# Patient Record
Sex: Female | Born: 1985 | Race: White | Hispanic: No | Marital: Married
Health system: Southern US, Community
[De-identification: ages and names within clinical notes are randomized; demographics above are authoritative.]

---
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
## 2007-12-31 ENCOUNTER — Ambulatory Visit: Payer: Self-pay | Admitting: Internal Medicine

## 2008-01-01 ENCOUNTER — Ambulatory Visit: Payer: Self-pay | Admitting: Internal Medicine

## 2008-02-18 ENCOUNTER — Ambulatory Visit: Payer: Self-pay

## 2008-03-27 ENCOUNTER — Ambulatory Visit: Payer: Self-pay | Admitting: Family Medicine

## 2008-04-10 ENCOUNTER — Ambulatory Visit: Payer: Self-pay | Admitting: Family Medicine

## 2008-09-20 IMAGING — CT CT ABD-PELV W/O CM
1 of 2 series · 14 of 32 positions shown, 18 images · non-contrast
Comparison: none

REASON FOR EXAM: lower abd pain hematuria fever  eval for kidney stones
COMMENTS:

[Series 2: soft tissue · axial · 0.66mm/px · z∈[-456,-64]mm · 14 of 149 slices shown, 18 images]
[im 12/149  soft-tissue]
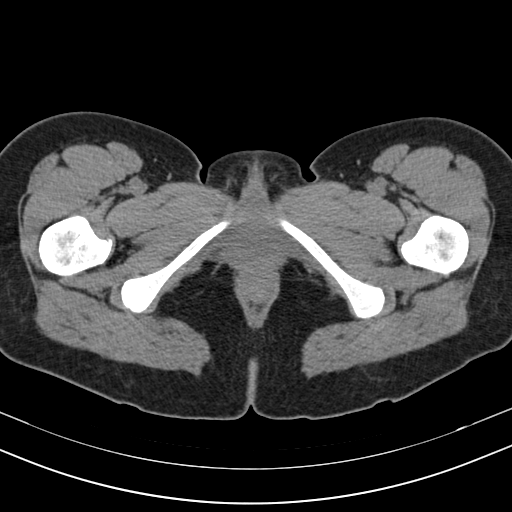
[im 12/149  bone]
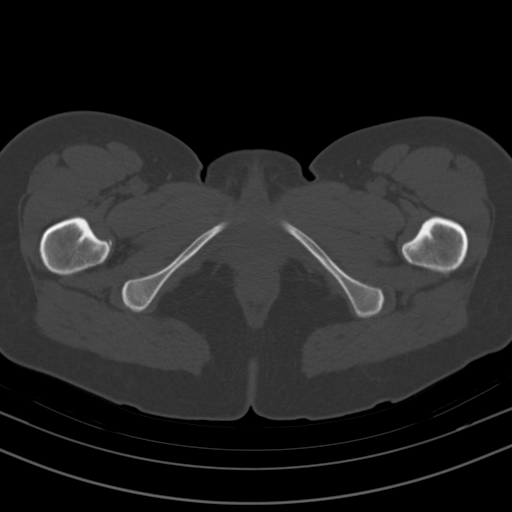
[im 23/149  soft-tissue]
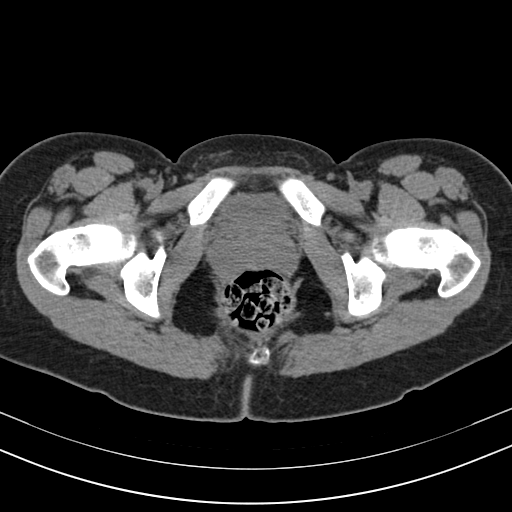
[im 35/149  soft-tissue]
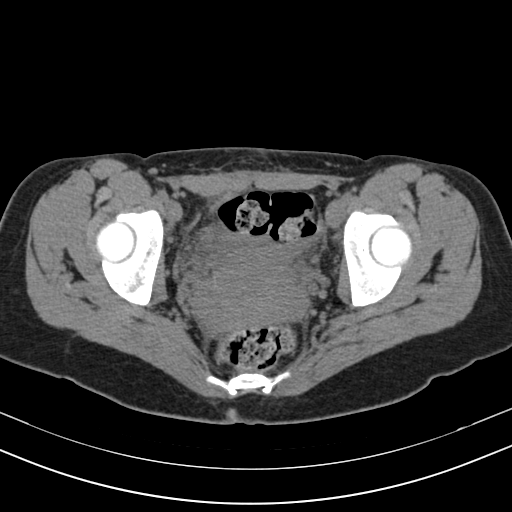
[im 46/149  soft-tissue]
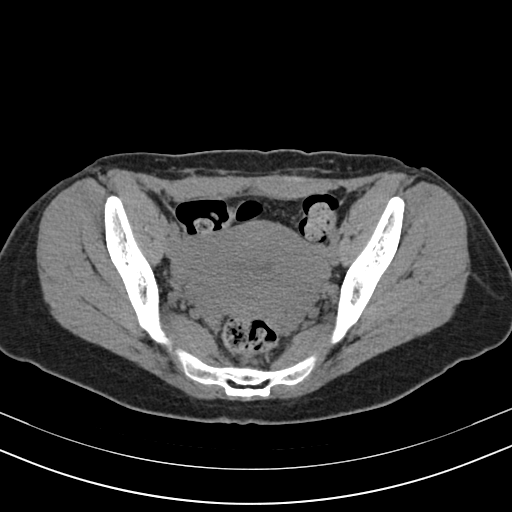
[im 57/149  soft-tissue]
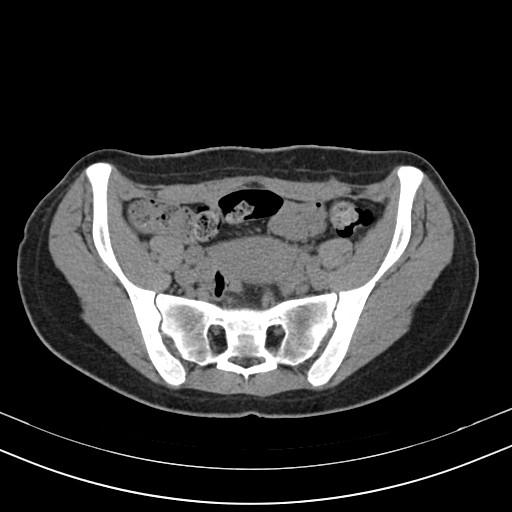
[im 69/149  soft-tissue]
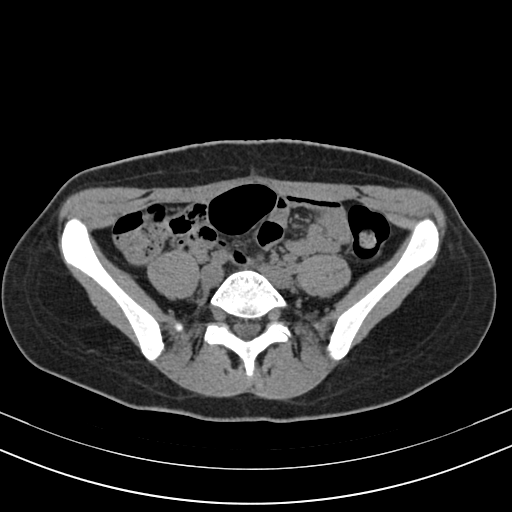
[im 80/149  soft-tissue]
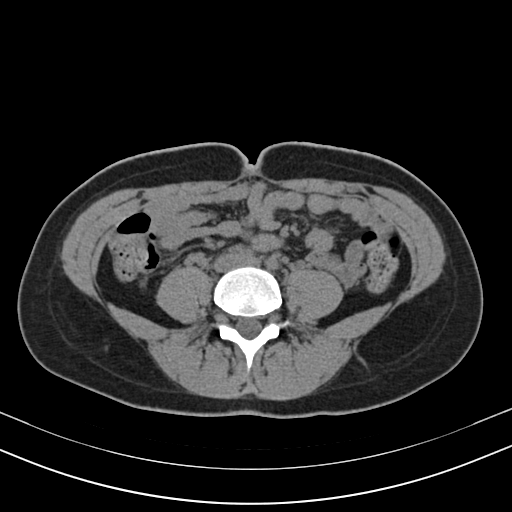
[im 92/149  soft-tissue]
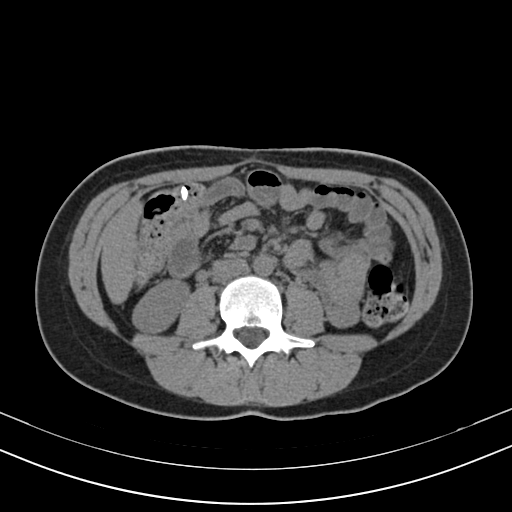
[im 103/149  soft-tissue]
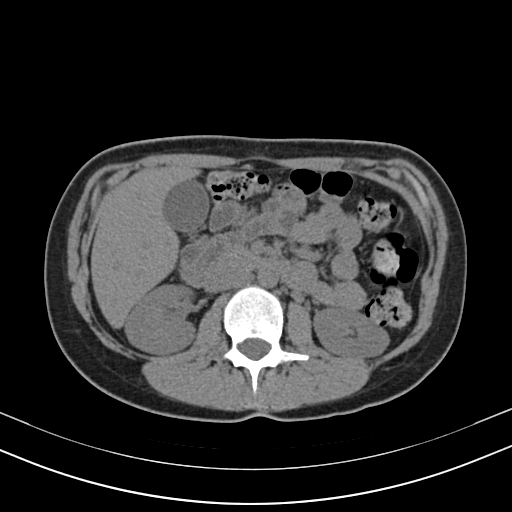
[im 103/149  bone]
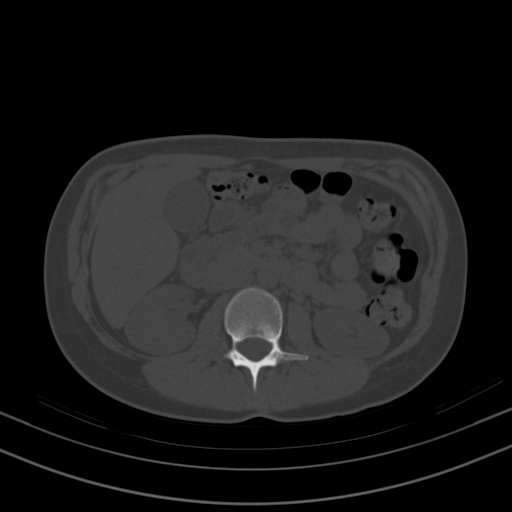
[im 114/149  soft-tissue]
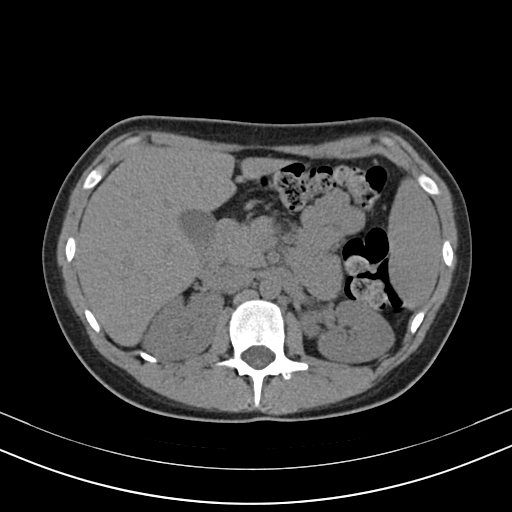
[im 126/149  soft-tissue]
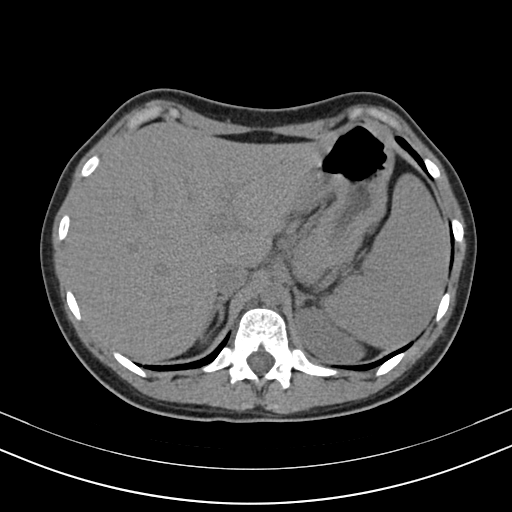
[im 126/149  lung]
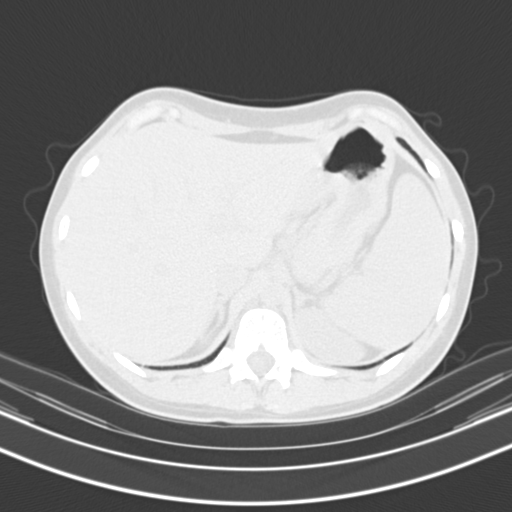
[im 131/149  lung]
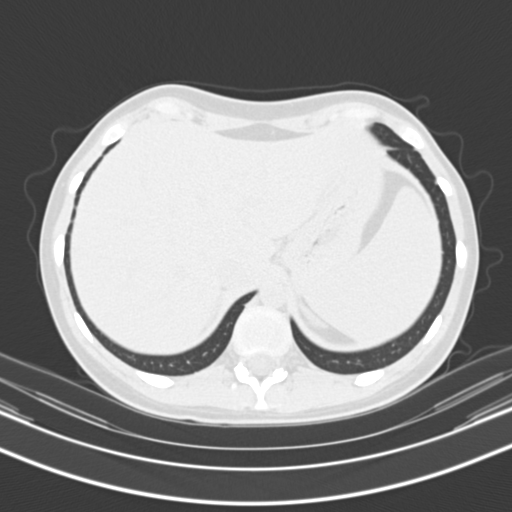
[im 137/149  soft-tissue]
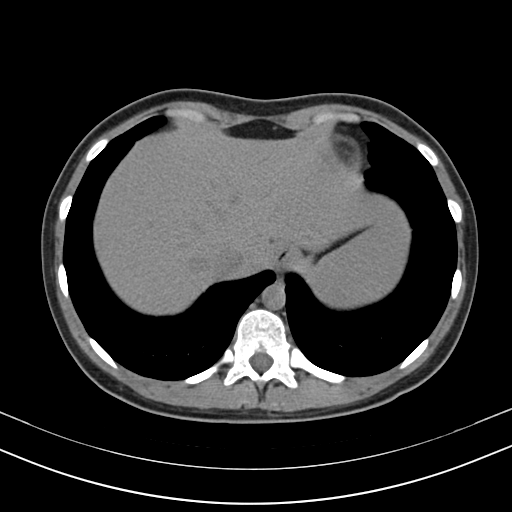
[im 137/149  lung]
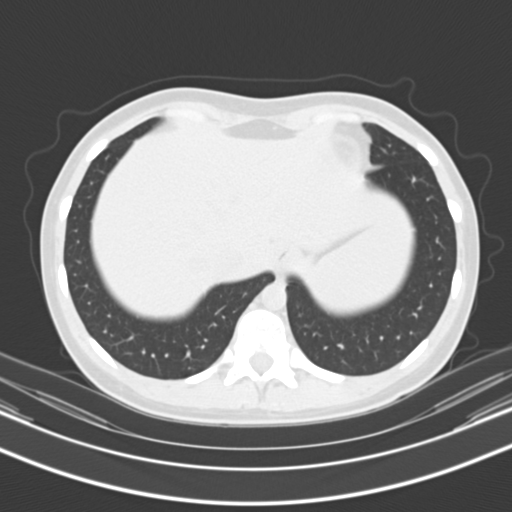
[im 143/149  lung]
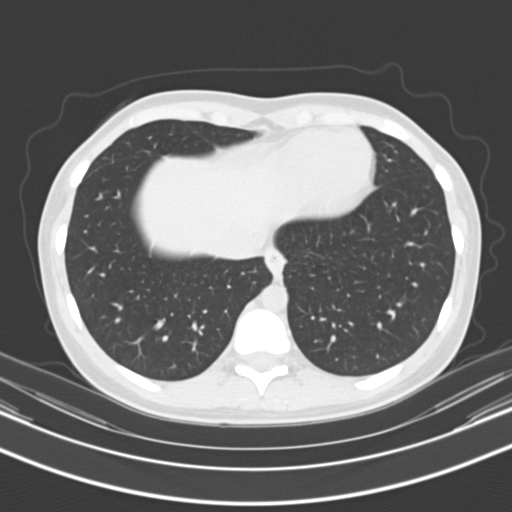

[14 of 32 positions shown; findings below may reference images not displayed]

PROCEDURE:     PITULIKUR - PITULIKUR ABDOMEN AND PELVIS WO  - January 01, 2008 [DATE]

RESULT:     The patient is undergoing evaluation for lower abdominal
discomfort, fever, and hematuria. The patient is approximately 3 weeks
postoperative from a cesarean section.

Within the pelvis, the uterus remains mildly prominent. I do not see
objective evidence of an abscess or free fluid. No adnexal mass is
identified. The kidneys exhibit normal contour and density with no evidence
of hydronephrosis. The perinephric fat is normal in density.

The liver exhibits no focal mass or ductal dilation. There is a linear
radiodensity that lies just inferior to the gallbladder but appears to most
likely be within bowel demonstrated best on image 59. This may reflect an
ingested radiodense medication or could reflect a foreign body. It is
anterior and inferior to the level of the kidneys.

The spleen, nondistended stomach, pancreas, and unopacified loops of small
and large bowel exhibit no acute abnormality. There is a radiodensity that
projects in the right upper pelvis anteriorly which may reflect an
appendicolith. A definite inflamed appendix is not demonstrated, however.
There is a phlebolith in the LEFT aspect of the pelvis. Within the rectum
there is a similar-appearing radiodensity to the two others described above.
Thus, overall I suspect that these reflect radiodense medication tablets.
The caliber of the abdominal aorta is normal. The lung bases are clear.
IMPRESSION: 1. I do not see evidence of urinary tract stones or urinary tract
obstruction on this study. Within the limits of the noncontrast exam no
evidence of inflammatory change associated with the urinary tract is seen.
The urinary bladder is partially distended but grossly normal.
2. The uterus remains mildly prominent which is not unusual for the
patient's 3 week post C-section state. I see no adnexal masses.
3. The bowel gas pattern is normal. There is radiodense material within the
rectum as well as in the region of the cecum and likely within the ascending
colon. This likely reflects dense medication tablets.

If the patient's symptoms persist and remain unexplained, further evaluation
with a contrast-enhanced CT scan would be of value.

## 2008-11-07 IMAGING — US ULTRASOUND RIGHT BREAST
1 series · 9 of 9 positions shown · non-contrast
Comparison: none

REASON FOR EXAM: right breast lump below areola   2 1/2 cm
COMMENTS:

[Series 1: ultrasound right breast · 9 of 9 slices shown]
[im 1/9]
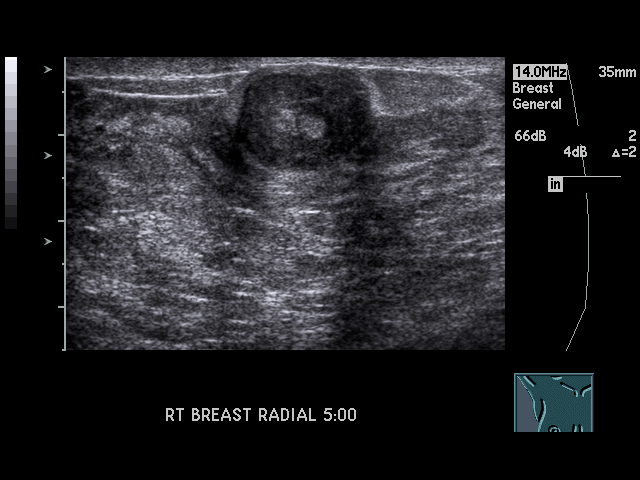
[im 2/9]
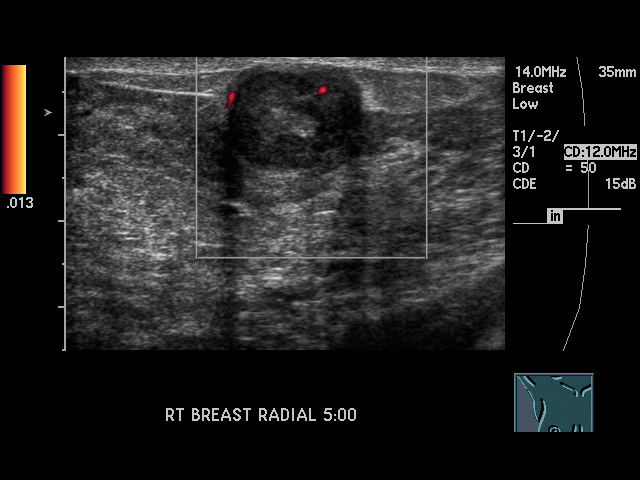
[im 3/9]
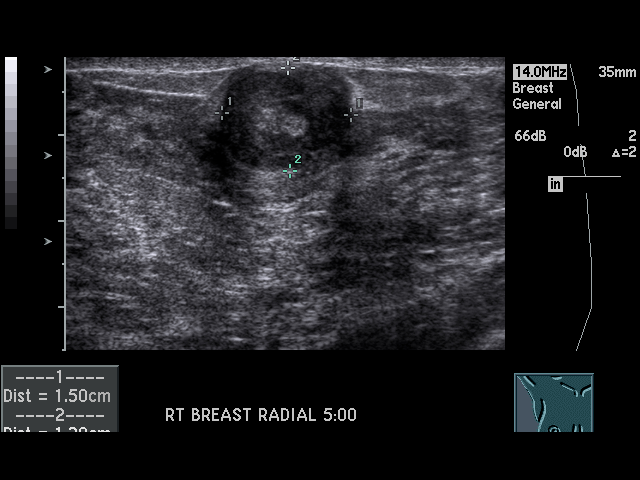
[im 4/9]
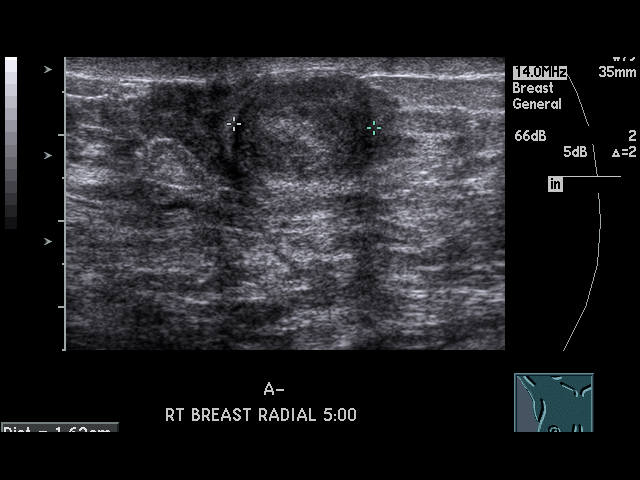
[im 5/9]
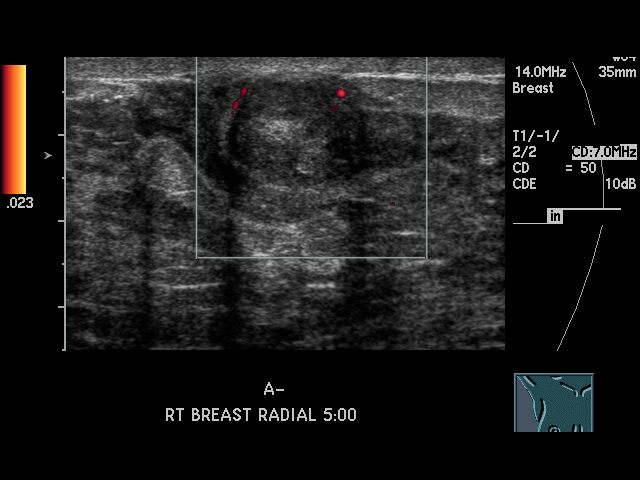
[im 6/9]
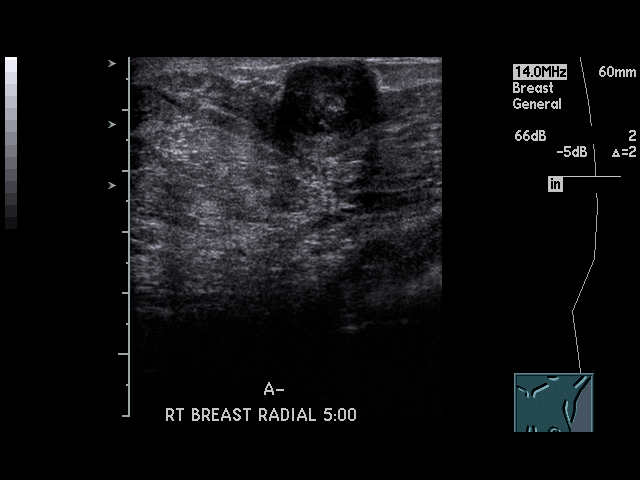
[im 7/9]
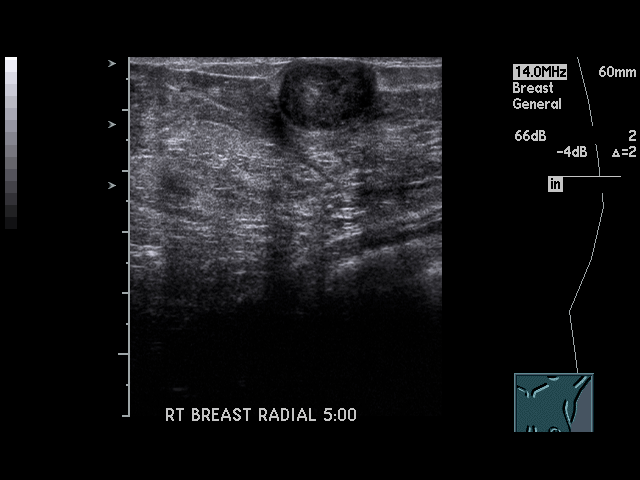
[im 8/9]
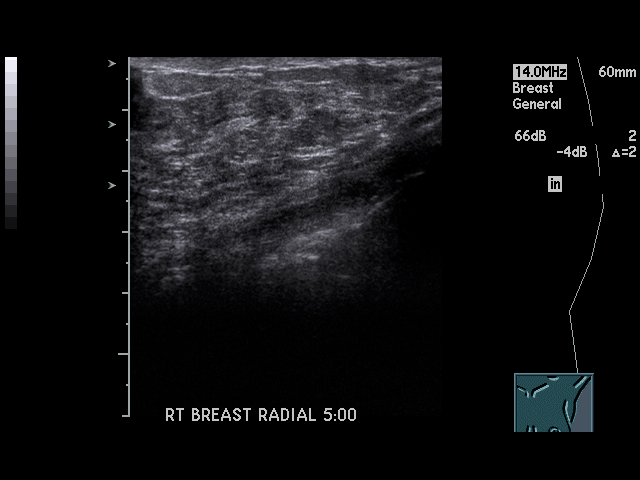
[im 9/9]
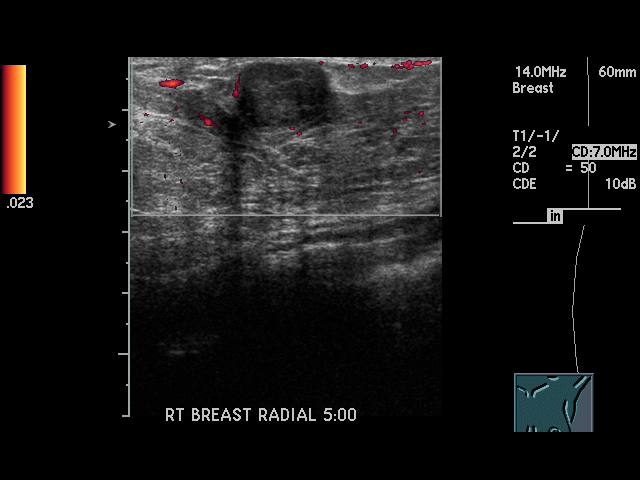

[9 of 9 positions shown; findings below may reference images not displayed]

PROCEDURE:     BLAIN - BLAIN BREAST RIGHT  - February 18, 2008  [DATE]

RESULT:     There is a hypoechoic subcutaneous mass at [DATE]. The appearance
is compatible with a solid mass or with a cystic mass containing a large
amount of debris. The mass measures 1.63 cm x 1.5 cm x 1.2 cm. These
measurements are smaller than the measurements reported on a prior outside
exam dated 01/29/2008 from [facility] [REDACTED] [facility].
This appears to likely represent a benign process, particularly since the
lesion has apparently decreased in size. The histology cannot be established
by ultrasound and consequently percutaneous biopsy or aspiration is
recommended.
IMPRESSION: Subcutaneous mass at 2 o'clock. On the current exam, the mass measures
cm at maximum diameter. Histology could be established by biopsy or
aspiration if clinically indicated.

## 2009-04-20 ENCOUNTER — Ambulatory Visit: Payer: Self-pay | Admitting: Internal Medicine

## 2009-10-20 ENCOUNTER — Ambulatory Visit: Payer: Self-pay | Admitting: Internal Medicine

## 2011-11-28 ENCOUNTER — Observation Stay: Payer: Self-pay

## 2011-12-18 ENCOUNTER — Ambulatory Visit: Payer: Self-pay | Admitting: Obstetrics & Gynecology

## 2011-12-18 LAB — CBC WITH DIFFERENTIAL/PLATELET
Basophil %: 0.1 %
Eosinophil #: 0.1 10*3/uL (ref 0.0–0.7)
Eosinophil %: 0.7 %
Lymphocyte #: 1.5 10*3/uL (ref 1.0–3.6)
Lymphocyte %: 16 %
MCH: 30.2 pg (ref 26.0–34.0)
MCHC: 34.6 g/dL (ref 32.0–36.0)
MCV: 87 fL (ref 80–100)
Monocyte #: 0.6 x10 3/mm (ref 0.2–0.9)
Neutrophil %: 77.2 %
Platelet: 208 10*3/uL (ref 150–440)
RDW: 14.3 % (ref 11.5–14.5)
WBC: 9.3 10*3/uL (ref 3.6–11.0)

## 2011-12-19 ENCOUNTER — Inpatient Hospital Stay: Payer: Self-pay | Admitting: Obstetrics & Gynecology

## 2011-12-20 LAB — HEMATOCRIT: HCT: 28.4 % — ABNORMAL LOW

## 2014-10-25 NOTE — Op Note (Signed)
PATIENT NAME:  Danielle ClarkSHOOP, Katiria R MR#:  [id number] DATE OF BIRTH:  April 05, 1986  DATE OF PROCEDURE:  12/19/2011  PREOPERATIVE DIAGNOSIS:  Term intrauterine pregnancy, prior history of cesarean section.  POSTOPERATIVE DIAGNOSIS:  Term intrauterine pregnancy, prior history of cesarean section.  PROCEDURE PERFORMED: Low transverse cesarean section.   SURGEON: Annamarie MajorPaul Stellan Vick, M.D.   ASSISTANT: Kate SablePhilip Rosenow, M.D.   ANESTHESIA: Spinal.   ESTIMATED BLOOD LOSS: 500 mL.   COMPLICATIONS: None.   FINDINGS: Normal tubes, ovaries, uterus. Viable female weighing 8 pounds, 6 ounces with Apgar scores of 9 and 9 at one and five minutes respectively.   DISPOSITION: To recovery room in stable condition.   TECHNIQUE: The patient is prepped and draped in the usual sterile fashion after adequate anesthesia is obtained in the supine position on the operating room table. A scalpel was used to create a low transverse skin incision in the area of the prior scar. The incision is carried down to the level of the rectus fascia, which is dissected bilaterally. The rectus muscles are separated in the midline. The peritoneum is penetrated and the serosa over the lower uterine segment is dissected with inferior retraction of the bladder. A scalpel was used to create a low transverse hysterotomy incision that is then extended by blunt dissection. Amniotomy then reveals clear fluid and the head is delivered with the vacuum suction device.  The oropharynx is suctioned and the nuchal cord is reduced and then the infant is delivered.   Cord blood is obtained and the placenta is manually extracted. The uterus is externalized and cleansed of all membranes and debris using a moist sponge. The hysterotomy incision is closed with a 1-0 Vicryl suture in a locking fashion followed by a second layer to imbricate the first layer with excellent hemostasis noted.   The uterus is then placed back in the intraabdominal cavity and the paracolic  gutters are irrigated using warm saline. Re-examination of the incision reveals excellent hemostasis. The peritoneum is closed. Trocars are placed through the abdominal wall into the subfascial space with placement of silver soaker catheters from the On-Q pain pump. The rectus fascia is closed with 0 Maxon suture with careful placement of suture not to incorporate these catheters. Subcutaneous tissues are irrigated and hemostasis is assured using electrocautery. Skin is closed with 4-0 Vicryl suture in a subcuticular fashion followed by placement of Dermabond. Dermabond is also used to stabilize the On-Q catheters in place. The On-Q catheters are flushed with 5 mL each of bupivacaine 0.5% and then they are bandaged in a secure place. The patient goes to the recovery room in stable condition. All sponge, instrument, and needle counts are correct.   ____________________________ R. Annamarie MajorPaul Emmit Oriley, MD [email or web address] D: 12/19/2011 08:33:58 ET T: 12/19/2011 11:20:19 ET JOB#: [id number]  cc: Dierdre Searles. Paul Broadus Costilla, MD, <Dictator> Nadara MustardOBERT P Zabella Wease MD ELECTRONICALLY SIGNED 12/19/2011 14:35
